# Patient Record
Sex: Male | Born: 1980 | Race: White | Hispanic: No | Marital: Married
Health system: Southern US, Community
[De-identification: ages and names within clinical notes are randomized; demographics above are authoritative.]

## PROBLEM LIST (undated history)

## (undated) DIAGNOSIS — I10 Essential (primary) hypertension: Secondary | ICD-10-CM

## (undated) DIAGNOSIS — E785 Hyperlipidemia, unspecified: Secondary | ICD-10-CM

## (undated) DIAGNOSIS — IMO0001 Reserved for inherently not codable concepts without codable children: Secondary | ICD-10-CM

## (undated) DIAGNOSIS — E669 Obesity, unspecified: Secondary | ICD-10-CM

## (undated) DIAGNOSIS — K449 Diaphragmatic hernia without obstruction or gangrene: Secondary | ICD-10-CM

## (undated) DIAGNOSIS — K219 Gastro-esophageal reflux disease without esophagitis: Secondary | ICD-10-CM

## (undated) HISTORY — DX: Obesity, unspecified: E66.9

## (undated) HISTORY — DX: Diaphragmatic hernia without obstruction or gangrene: K44.9

## (undated) HISTORY — DX: Essential (primary) hypertension: I10

## (undated) HISTORY — PX: WISDOM TOOTH EXTRACTION: SHX21

## (undated) HISTORY — PX: HAND SURGERY: SHX662

## (undated) HISTORY — PX: TONSILLECTOMY: SUR1361

## (undated) HISTORY — DX: Reserved for inherently not codable concepts without codable children: IMO0001

## (undated) HISTORY — DX: Hyperlipidemia, unspecified: E78.5

## (undated) HISTORY — DX: Gastro-esophageal reflux disease without esophagitis: K21.9

---
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
## 2003-08-01 ENCOUNTER — Emergency Department (HOSPITAL_COMMUNITY): Admission: EM | Admit: 2003-08-01 | Discharge: 2003-08-01 | Payer: Self-pay | Admitting: Emergency Medicine

## 2005-05-25 ENCOUNTER — Emergency Department (HOSPITAL_COMMUNITY): Admission: EM | Admit: 2005-05-25 | Discharge: 2005-05-25 | Payer: Self-pay | Admitting: Emergency Medicine

## 2005-05-29 ENCOUNTER — Ambulatory Visit (HOSPITAL_COMMUNITY): Admission: RE | Admit: 2005-05-29 | Discharge: 2005-05-29 | Payer: Self-pay | Admitting: Orthopedic Surgery

## 2005-05-29 ENCOUNTER — Ambulatory Visit (HOSPITAL_BASED_OUTPATIENT_CLINIC_OR_DEPARTMENT_OTHER): Admission: RE | Admit: 2005-05-29 | Discharge: 2005-05-29 | Payer: Self-pay | Admitting: Orthopedic Surgery

## 2010-06-27 ENCOUNTER — Emergency Department (HOSPITAL_BASED_OUTPATIENT_CLINIC_OR_DEPARTMENT_OTHER): Admission: EM | Admit: 2010-06-27 | Discharge: 2010-06-27 | Payer: Self-pay | Admitting: Emergency Medicine

## 2011-01-30 NOTE — Op Note (Signed)
Mason Young, Mason Young             ACCOUNT NO.:  [id number]   MEDICAL RECORD NO.:  [id number]          PATIENT TYPE:  AMB   LOCATION:  [facility]SC                          FACILITY:  [facility]   PHYSICIAN:  Cindee Salt, M.D.       DATE OF BIRTH:  1981-01-31   DATE OF PROCEDURE:  05/29/2005  DATE OF DISCHARGE:                                 OPERATIVE REPORT   PREOPERATIVE DIAGNOSIS:  Laceration of dorsal ulnar aspect of left hand.   POSTOPERATIVE DIAGNOSIS:  Laceration of dorsal ulnar aspect of left hand.   OPERATION:  Exploration of dorsal sensory ulnar nerve, left hand.   SURGEON:  Kuzma   ASSISTANT:  Inman.   ANESTHESIA:  Forearm-based IV regional.   HISTORY:  The patient is a 30 year old male who suffered a laceration of the  dorsal ulnar aspect of his left hand.  He is concerned about deep injury.  He is complaining of some decreased  sensation, pain with extension.   PROCEDURE:  The patient is brought to the operating room, the hand marked by  patient and surgeon.  He was taken to the operating room where a forearm-  based IV regional anesthetic was carried out without difficulty.  He was  prepped using DuraPrep, supine position, left arm free.  The dorsal aspect  of the wound was extended proximally and distally and carried down through  subcutaneous tissue.  The area was elevated.  The laceration was noted to go  down to the dorsal ulnar sensory nerve, which was found to be intact.  The  abductor and flexor digiti quinti were also both noted to be intact without  lacerations.  The wound was irrigated.  The skin was then closed with  interrupted 5-0 nylon sutures.  A sterile compressive dressing was applied.  The patient tolerated the procedure well and was taken to the recovery room  for observation in satisfactory condition.  He is discharged home to return  to the [facility] in 1 week on Vicodin.           ______________________________  Cindee Salt,  M.D.     GK/MEDQ  D:  05/29/2005  T:  05/29/2005  Job:  [id number]

## 2011-04-03 ENCOUNTER — Encounter (INDEPENDENT_AMBULATORY_CARE_PROVIDER_SITE_OTHER): Payer: Self-pay | Admitting: General Surgery

## 2012-01-14 ENCOUNTER — Other Ambulatory Visit (HOSPITAL_BASED_OUTPATIENT_CLINIC_OR_DEPARTMENT_OTHER): Payer: Self-pay | Admitting: Specialist

## 2012-01-14 ENCOUNTER — Ambulatory Visit (HOSPITAL_BASED_OUTPATIENT_CLINIC_OR_DEPARTMENT_OTHER)
Admission: RE | Admit: 2012-01-14 | Discharge: 2012-01-14 | Disposition: A | Discharge status: Home / Self Care | Payer: BC Managed Care – PPO | Source: Ambulatory Visit | Attending: Specialist | Admitting: Specialist

## 2012-01-14 DIAGNOSIS — Z9889 Other specified postprocedural states: Secondary | ICD-10-CM

## 2012-01-14 DIAGNOSIS — Z01818 Encounter for other preprocedural examination: Secondary | ICD-10-CM

## 2012-01-14 DIAGNOSIS — Z77018 Contact with and (suspected) exposure to other hazardous metals: Secondary | ICD-10-CM | POA: Insufficient documentation

## 2013-05-02 ENCOUNTER — Encounter: Payer: BC Managed Care – PPO | Attending: Family Medicine | Admitting: Dietician

## 2013-05-02 ENCOUNTER — Encounter: Payer: Self-pay | Admitting: Dietician

## 2013-05-02 DIAGNOSIS — E669 Obesity, unspecified: Secondary | ICD-10-CM | POA: Insufficient documentation

## 2013-05-02 DIAGNOSIS — Z713 Dietary counseling and surveillance: Secondary | ICD-10-CM | POA: Insufficient documentation

## 2013-05-02 NOTE — Progress Notes (Signed)
Medical Nutrition Therapy:  Appt start time: 1030 end time:  1130.   Assessment:  Primary concerns today: Interested in losing weight to be around for and healthy for kids. Keeps gaining weight even thought thinks he's doing the right things. States that he needed IV nutrition because he was "small" when [age] years. Was put on blood pressure medication when he was 32 years old since he had migraines which stopped around age 77-15. Had panic attacks starting in high school and was put on psychiatric medication (Risperdal) and reports he was so "doped up" that he didn't go to school for junior and senior year which is when he stated is when starting getting weight. Went from 225 to 325 lbs in two years when on that medication. Started Exforge two years ago and gained another 100 pounds.   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 Often hours vary and can work in middle of the night. Eats out 4-5 days per week. States they don't do much food preparation at home.   Has tried Weight Watchers before and did not lose any weight. Weight loss goal is 250-275 lbs. Has been walking 3-4 times per week for years. States he never feels hungry though does feel full when he eats too much. Reports being confused and frustrated by his weight and will consider bariatric surgery if weight loss efforts don't work.   MEDICATIONS: see list   DIETARY INTAKE:  Usual eating pattern includes 2 meals and 0 snacks per day.  24-hr recall:  B ( AM): [employer] egg white muffin with sausage with diet coke, strawberry special K with skim milk   Snk ( AM): none  L ( PM): none Snk ( PM): none D ( PM): steak or hamburgers or chicken with vegetables with water or half and half sweet tea Snk ( PM): none Beverages: mostly water throughout the day, diet coke, half and half tea, rarely alcohol  Usual physical activity: walks 30-45 minutes 4 x week  Estimated energy  needs: 2200 calories 248 g carbohydrates 165 g protein 61 g fat  Progress Towards Goal(s):  In progress.   Nutritional Diagnosis:  NB-1.1 Food and nutrition-related knowledge deficit As related to frequent restaurant meals and meal skipping.  As evidenced by BMI of 54.5.    Intervention:  Nutrition Counseling provided. Recommended eating 3 meals per day and snacks and limit eating restaurant meals. Explained how meal skipping can contribute to weight gain. Provided examples of meal plans since Mason Young wanted to see what his meal pattern should look like. Recommended portioning out small amounts of food on plate and stopping when he feels full. Recommended adding snacks, including protein shakes, between meals to prevent overeating at meal time. Suggested using [employer] to plan restaurant meals ahead of time and incorporating more exercise into his weekly routine to help facilitate weight loss.    Eat three meals per day and 2-3 snacks, eating every 3-5 hours. Have snacks with  protein and carbohydrates. Fill half of your plate with vegetables.  Recommend eating restaurant foods 1 x week.  Check [employer]orie[employer] to plan restaurant meal ahead.  Serve yourself small portions of foods and stop eating when you no longer hungry.  Try to increase time spent exercising up to goal of 60 minutes per day.   Handouts given during visit include:  MyPlate Handout   15g CHO Snacks  2200 and 1800 calorie meal plans  Protein  Shake Handout   Monitoring/Evaluation:  Dietary intake, exercise, and body weight in 2 week(s).

## 2013-05-02 NOTE — Patient Instructions (Addendum)
Eat three meals per day and 2-3 snacks, eating every 3-5 hours. Have snacks with  protein and carbohydrates. Fill half of your plate with vegetables.  Recommend eating restaurant foods 1 x week.  Check [employer]orie[employer] to plan restaurant meal ahead.  Serve yourself small portions of foods and stop eating when you no longer hungry.  Try to increase time spent exercising up to goal of 60 minutes per day.

## 2013-05-16 ENCOUNTER — Ambulatory Visit: Payer: BC Managed Care – PPO | Admitting: Dietician

## 2013-11-22 ENCOUNTER — Other Ambulatory Visit: Payer: Self-pay | Admitting: Sports Medicine

## 2013-11-22 DIAGNOSIS — M25569 Pain in unspecified knee: Secondary | ICD-10-CM

## 2013-12-07 ENCOUNTER — Other Ambulatory Visit: Payer: BC Managed Care – PPO

## 2014-01-28 ENCOUNTER — Ambulatory Visit
Admission: RE | Admit: 2014-01-28 | Discharge: 2014-01-28 | Disposition: A | Discharge status: Home / Self Care | Payer: BC Managed Care – PPO | Source: Ambulatory Visit | Attending: Sports Medicine | Admitting: Sports Medicine

## 2014-01-28 DIAGNOSIS — M25569 Pain in unspecified knee: Secondary | ICD-10-CM

## 2016-11-06 ENCOUNTER — Other Ambulatory Visit: Payer: Self-pay | Admitting: Orthopedic Surgery

## 2016-11-06 DIAGNOSIS — R52 Pain, unspecified: Secondary | ICD-10-CM

## 2016-11-06 DIAGNOSIS — R609 Edema, unspecified: Secondary | ICD-10-CM

## 2016-11-09 ENCOUNTER — Ambulatory Visit
Admission: RE | Admit: 2016-11-09 | Discharge: 2016-11-09 | Disposition: A | Discharge status: Home / Self Care | Payer: BLUE CROSS/BLUE SHIELD | Source: Ambulatory Visit | Attending: Orthopedic Surgery | Admitting: Orthopedic Surgery

## 2016-11-09 DIAGNOSIS — R52 Pain, unspecified: Secondary | ICD-10-CM

## 2016-11-09 DIAGNOSIS — R609 Edema, unspecified: Secondary | ICD-10-CM

## 2017-06-16 ENCOUNTER — Other Ambulatory Visit: Payer: Self-pay | Admitting: Orthopedic Surgery

## 2017-06-16 DIAGNOSIS — M25562 Pain in left knee: Secondary | ICD-10-CM

## 2017-06-26 ENCOUNTER — Other Ambulatory Visit: Payer: BLUE CROSS/BLUE SHIELD

## 2017-07-17 ENCOUNTER — Ambulatory Visit
Admission: RE | Admit: 2017-07-17 | Discharge: 2017-07-17 | Disposition: A | Discharge status: Home / Self Care | Payer: BLUE CROSS/BLUE SHIELD | Source: Ambulatory Visit | Attending: Orthopedic Surgery | Admitting: Orthopedic Surgery

## 2017-07-17 DIAGNOSIS — M25562 Pain in left knee: Secondary | ICD-10-CM

## 2018-11-29 IMAGING — MR MR KNEE*L* W/O CM
6 series · 36 of 40 positions shown · non-contrast
Comparison: None.

CLINICAL DATA: Medial left knee pain with catching sensation for 2
months. Left knee weakness.

EXAM:
MRI OF THE LEFT KNEE WITHOUT CONTRAST
TECHNIQUE: Multiplanar, multisequence MR imaging of the knee was performed. No
intravenous contrast was administered.

[Series 9: T1 · coronal · left · 3.0mm · 0.47mm/px · 3 of 41 slices shown]
[im 1/41]
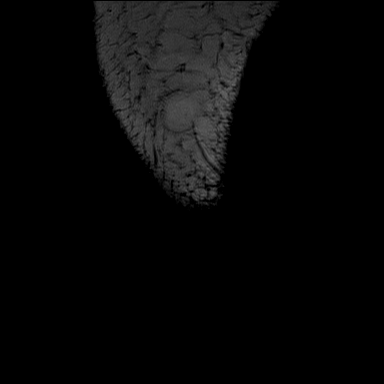
[im 7/41]
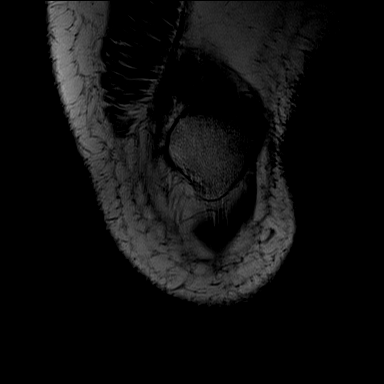
[im 14/41]
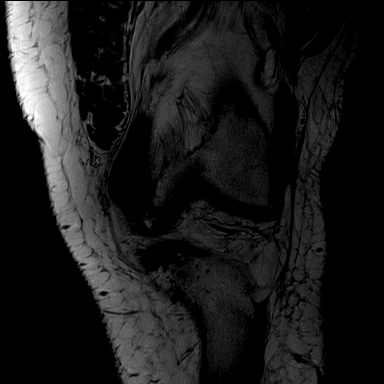

[Series 10: PD fat-sat · coronal · left · 3.0mm · 0.47mm/px · 7 of 41 slices shown (1 of 3)]
[im 1/41]
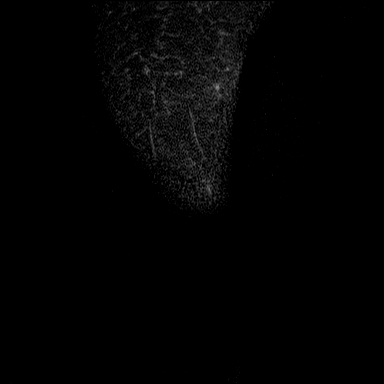
[im 7/41]
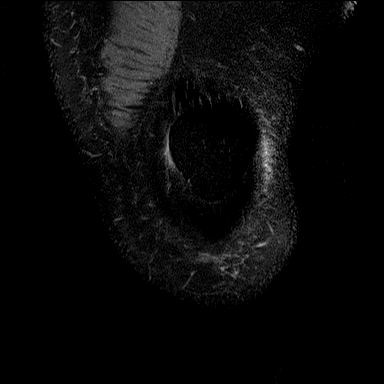
[im 14/41]
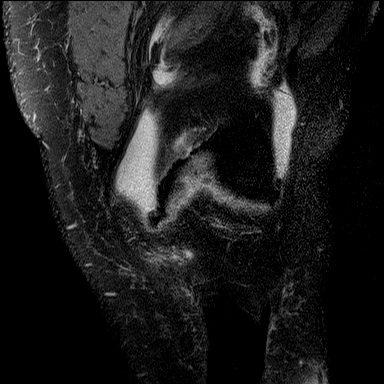
[im 21/41]
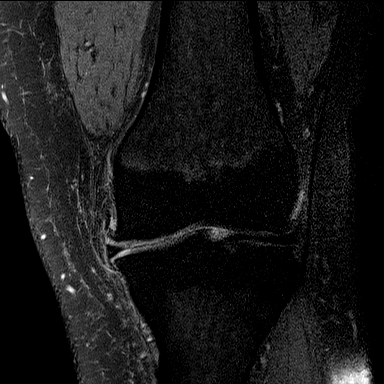
[im 27/41]
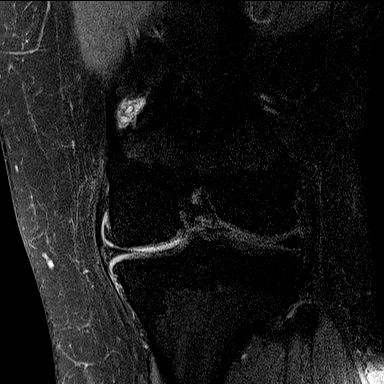
[im 34/41]
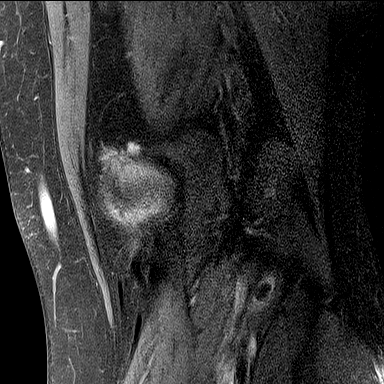
[im 41/41]
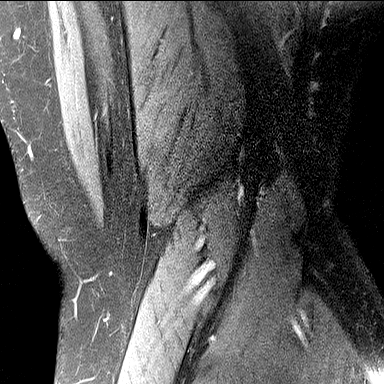

[Series 11: T2 fat-sat · coronal · left · 3.0mm · 0.47mm/px · 7 of 41 slices shown]
[im 1/41]
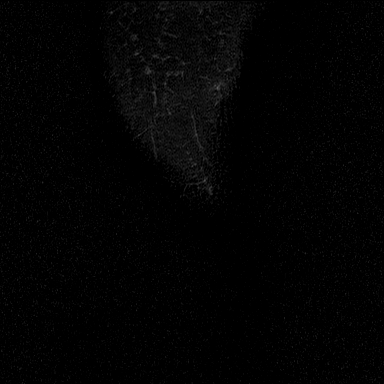
[im 7/41]
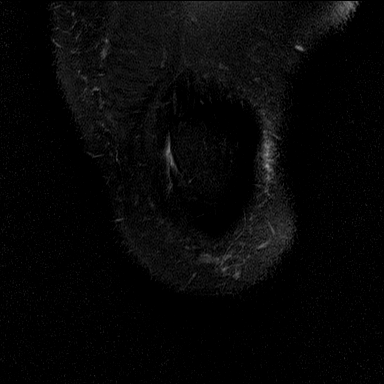
[im 14/41]
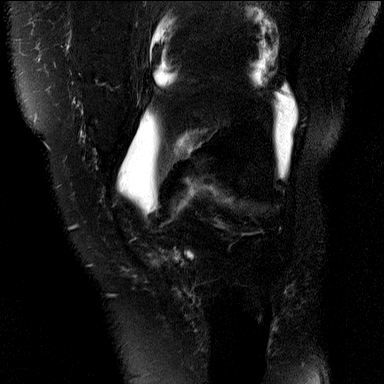
[im 21/41]
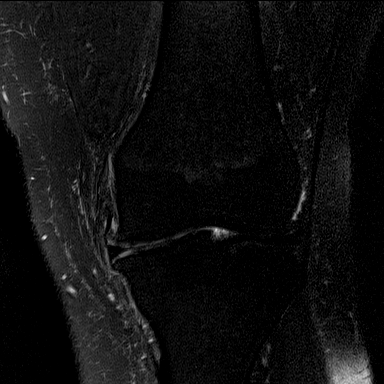
[im 27/41]
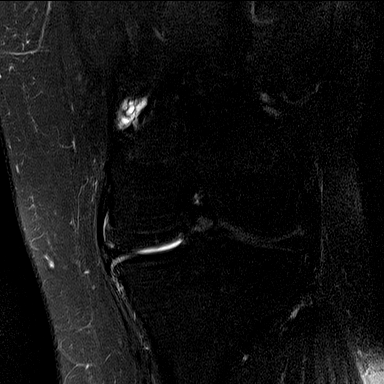
[im 34/41]
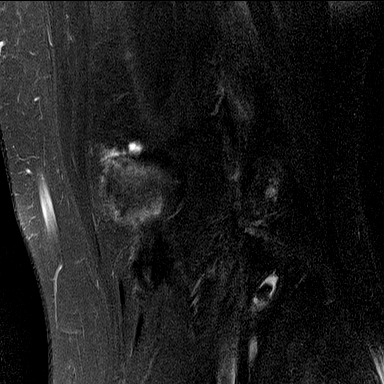
[im 41/41]
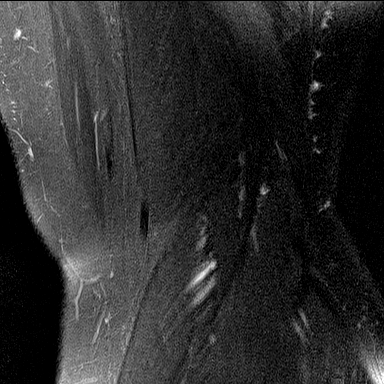

[Series 12: PD fat-sat · sagittal · left · 3.2mm · 0.56mm/px · 7 of 37 slices shown (2 of 3)]
[im 1/37]
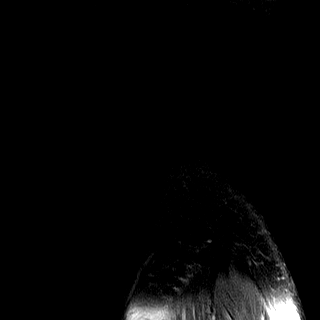
[im 7/37]
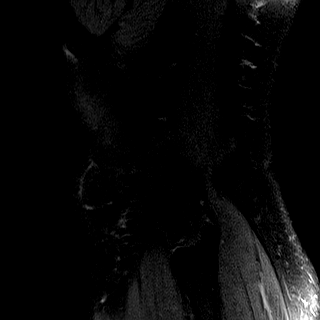
[im 13/37]
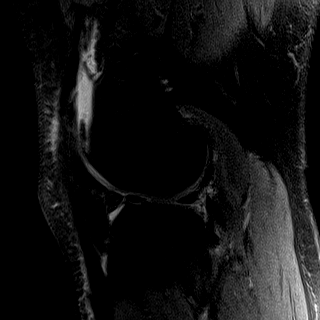
[im 19/37]
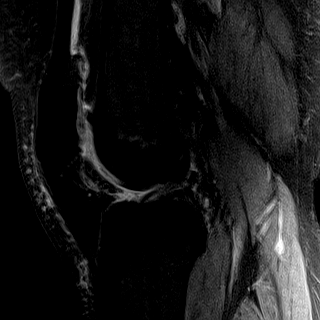
[im 25/37]
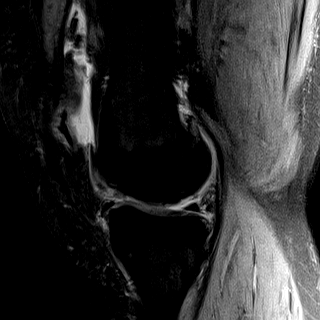
[im 31/37]
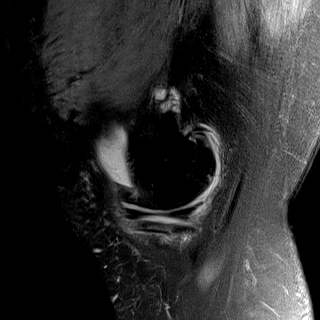
[im 37/37]
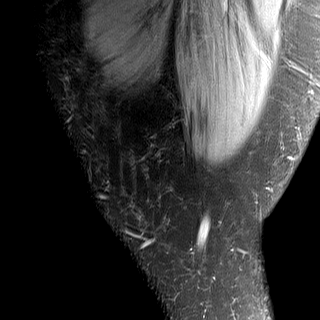

[Series 13: PD fat-sat · axial · left · 4.0mm · 0.47mm/px · z∈[-96,+70]mm · 7 of 39 slices shown (3 of 3)]
[im 1/39]
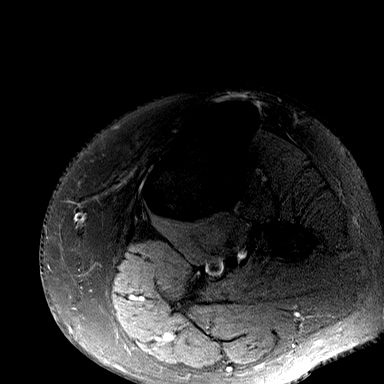
[im 7/39]
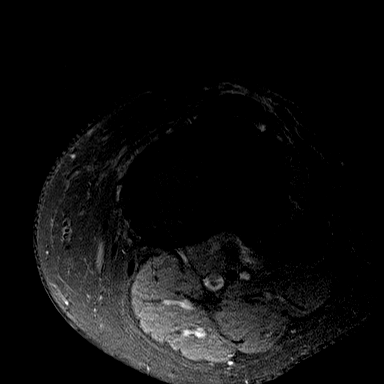
[im 13/39]
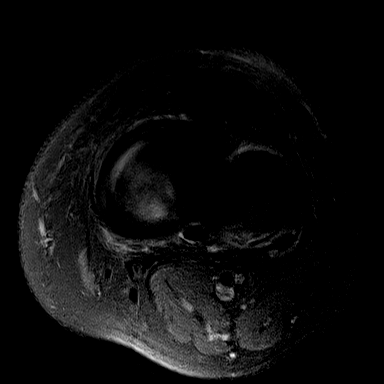
[im 20/39]
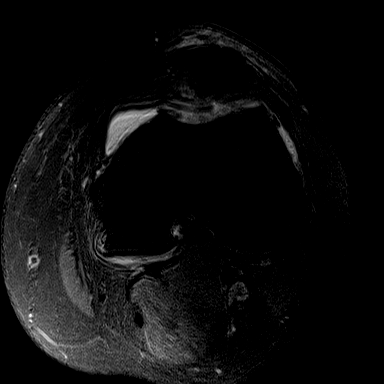
[im 26/39]
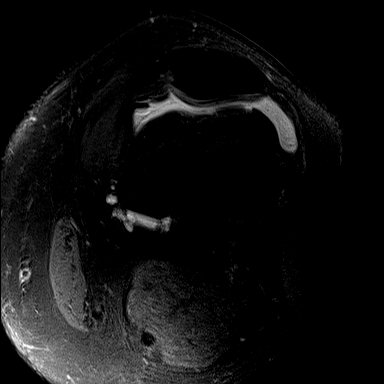
[im 32/39]
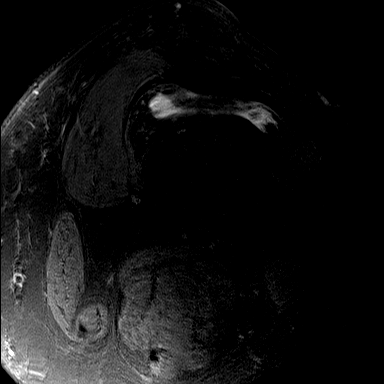
[im 39/39]
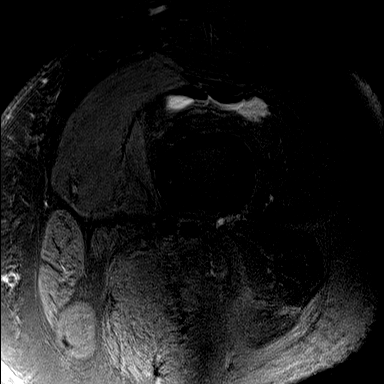

[Series 14: PD · oblique · left · 1.5mm · 0.44mm/px · 5 of 27 slices shown]
[im 1/27]
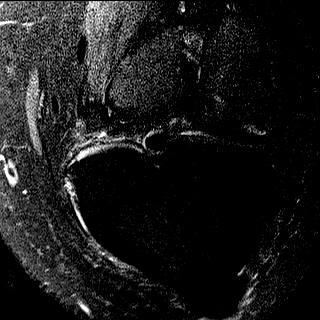
[im 7/27]
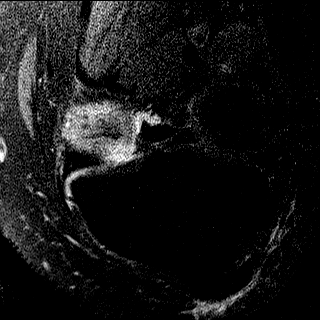
[im 14/27]
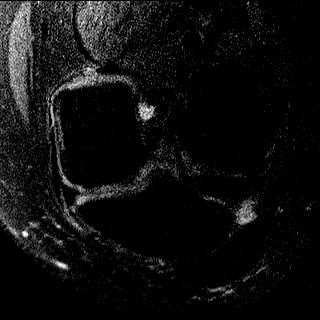
[im 20/27]
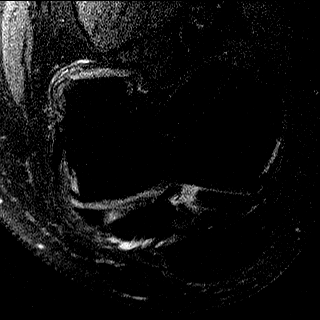
[im 27/27]
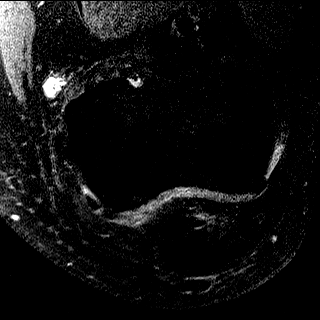

[36 of 40 positions shown; findings below may reference images not displayed]

FINDINGS: Despite efforts by the technologist and patient, motion artifact is
present on today's exam and could not be eliminated. This reduces
exam sensitivity and specificity.

Due to patient body habitus, we could not utilize the knee coil. The
flex coil was utilized instead. Because of this there is reduced
signal to noise ratio which can adversely affect diagnostic
accuracy.

MENISCI

Medial meniscus:  Unremarkable

Lateral meniscus:  Unremarkable

LIGAMENTS

Cruciates:  Unremarkable

Collaterals:  Unremarkable

CARTILAGE

Patellofemoral: Mild chondral thinning with chondral heterogeneity
in the irregularity of the femoral trochlear groove and to a lesser
degree along the lateral patellar facet and posterior patellar
ridge. Marginal spurring.

Medial: Moderate chondral thinning along the medial femoral condyles
with considerable marginal spurring

Lateral:  Marginal spurring.

Joint: Small knee effusion with synovitis. Thickened medial plica.
Small filling defect along the popliteus recess on image 34/11,
potentially a small osteochondral fragment.

Popliteal Fossa:  Unremarkable

Extensor Mechanism: Tibial tubercle -trochlear groove distance
cm. No significant abnormality.

Bones: No significant extra-articular osseous abnormalities
identified.

Other: No supplemental non-categorized findings.
IMPRESSION: 1. Moderate osteoarthritis, with chondral heterogeneity and
irregularity most notable in the femoral trochlear groove. Possible
small free osteochondral fragment along the popliteus recess of the
joint.
2. Small knee effusion with synovitis and a thickened medial plica.
